# Patient Record
Sex: Female | Born: 1985 | Race: Black or African American | Hispanic: No | Marital: Single | Smoking: Never smoker
Health system: Southern US, Community
[De-identification: ages and names within clinical notes are randomized; demographics above are authoritative.]

## PROBLEM LIST (undated history)

## (undated) DIAGNOSIS — A749 Chlamydial infection, unspecified: Secondary | ICD-10-CM

## (undated) DIAGNOSIS — K469 Unspecified abdominal hernia without obstruction or gangrene: Secondary | ICD-10-CM

---
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
## 2009-03-20 ENCOUNTER — Inpatient Hospital Stay (HOSPITAL_COMMUNITY): Admission: AD | Admit: 2009-03-20 | Discharge: 2009-03-20 | Payer: Self-pay | Admitting: Psychiatry

## 2009-04-09 ENCOUNTER — Inpatient Hospital Stay (HOSPITAL_COMMUNITY): Admission: AD | Admit: 2009-04-09 | Discharge: 2009-04-09 | Payer: Self-pay | Admitting: Obstetrics & Gynecology

## 2009-05-05 ENCOUNTER — Ambulatory Visit (HOSPITAL_COMMUNITY): Admission: RE | Admit: 2009-05-05 | Discharge: 2009-05-05 | Payer: Self-pay | Admitting: Obstetrics & Gynecology

## 2009-05-31 ENCOUNTER — Inpatient Hospital Stay (HOSPITAL_COMMUNITY): Admission: AD | Admit: 2009-05-31 | Discharge: 2009-05-31 | Payer: Self-pay | Admitting: Obstetrics & Gynecology

## 2009-09-01 ENCOUNTER — Inpatient Hospital Stay (HOSPITAL_COMMUNITY): Admission: AD | Admit: 2009-09-01 | Discharge: 2009-09-01 | Payer: Self-pay | Admitting: Obstetrics & Gynecology

## 2009-09-27 ENCOUNTER — Inpatient Hospital Stay (HOSPITAL_COMMUNITY): Admission: AD | Admit: 2009-09-27 | Discharge: 2009-09-27 | Payer: Self-pay | Admitting: Obstetrics

## 2009-10-01 ENCOUNTER — Ambulatory Visit (HOSPITAL_COMMUNITY): Admission: RE | Admit: 2009-10-01 | Discharge: 2009-10-01 | Payer: Self-pay | Admitting: Obstetrics & Gynecology

## 2009-10-04 ENCOUNTER — Inpatient Hospital Stay (HOSPITAL_COMMUNITY): Admission: AD | Admit: 2009-10-04 | Discharge: 2009-10-07 | Payer: Self-pay | Admitting: Obstetrics & Gynecology

## 2009-11-14 ENCOUNTER — Emergency Department (HOSPITAL_COMMUNITY): Admission: EM | Admit: 2009-11-14 | Discharge: 2009-11-14 | Payer: Self-pay | Admitting: Family Medicine

## 2010-05-01 ENCOUNTER — Emergency Department (HOSPITAL_COMMUNITY)
Admission: EM | Admit: 2010-05-01 | Discharge: 2010-05-01 | Payer: Self-pay | Source: Home / Self Care | Admitting: Emergency Medicine

## 2010-07-20 LAB — CBC
HCT: 31.9 % — ABNORMAL LOW (ref 36.0–46.0)
Hemoglobin: 11.1 g/dL — ABNORMAL LOW (ref 12.0–15.0)
Hemoglobin: 10.2 g/dL — ABNORMAL LOW (ref 12.0–15.0)
MCHC: 33.4 g/dL (ref 30.0–36.0)
MCHC: 33.8 g/dL (ref 30.0–36.0)
MCV: 91.6 fL (ref 78.0–100.0)
MCV: 92.1 fL (ref 78.0–100.0)
Platelets: 182 10*3/uL (ref 150–400)
RBC: 3.47 MIL/uL — ABNORMAL LOW (ref 3.87–5.11)
RBC: 3.29 MIL/uL — ABNORMAL LOW (ref 3.87–5.11)
RBC: 3.48 MIL/uL — ABNORMAL LOW (ref 3.87–5.11)
RDW: 12.9 % (ref 11.5–15.5)

## 2010-07-20 LAB — ABO/RH: ABO/RH(D): O POS

## 2010-07-21 LAB — URINALYSIS, ROUTINE W REFLEX MICROSCOPIC
Nitrite: NEGATIVE
Protein, ur: NEGATIVE mg/dL
Specific Gravity, Urine: 1.01 (ref 1.005–1.030)
Urobilinogen, UA: 0.2 mg/dL (ref 0.0–1.0)

## 2011-04-13 ENCOUNTER — Emergency Department (HOSPITAL_COMMUNITY)
Admission: EM | Admit: 2011-04-13 | Discharge: 2011-04-13 | Discharge status: Against Medical Advice | Payer: Self-pay | Attending: Emergency Medicine | Admitting: Emergency Medicine

## 2011-04-13 ENCOUNTER — Encounter: Payer: Self-pay | Admitting: Emergency Medicine

## 2011-04-13 DIAGNOSIS — H109 Unspecified conjunctivitis: Secondary | ICD-10-CM

## 2011-04-13 DIAGNOSIS — H5789 Other specified disorders of eye and adnexa: Secondary | ICD-10-CM | POA: Insufficient documentation

## 2011-04-13 DIAGNOSIS — H538 Other visual disturbances: Secondary | ICD-10-CM | POA: Insufficient documentation

## 2011-04-13 DIAGNOSIS — H5710 Ocular pain, unspecified eye: Secondary | ICD-10-CM | POA: Insufficient documentation

## 2011-04-13 MED ORDER — POLYMYXIN B-TRIMETHOPRIM 10000-0.1 UNIT/ML-% OP SOLN: 1.0000 [drp] | OPHTHALMIC | Status: AC

## 2011-04-13 NOTE — ED Provider Notes (Signed)
History     CSN: [id number] Arrival date & time: 04/13/2011  8:13 PM   None     Chief Complaint  Patient presents with  . Eye Pain    possible pink eye    (Consider location/radiation/quality/duration/timing/severity/associated sxs/prior treatment) HPI Comments: Patient reports, right by discharge, redness, slight blurring of the vision for the past 5 days wakes in the morning with eyes matted shut.  Has not tried any therapies other than washing with soap and water  Patient is a 25 y.o. female presenting with eye pain. The history is provided by the patient.  Eye Pain This is a new problem. The current episode started in the past 7 days. The problem occurs constantly. The problem has been unchanged. Pertinent negatives include no nausea, neck pain or visual change.    History reviewed. No pertinent past medical history.  History reviewed. No pertinent past surgical history.  No family history on file.  History  Substance Use Topics  . Smoking status: Never Smoker   . Smokeless tobacco: Not on file  . Alcohol Use: Not on file    OB History    Grav Para Term Preterm Abortions TAB SAB Ect Mult Living                  Review of Systems  Constitutional: Negative.   HENT: Negative for facial swelling and neck pain.   Eyes: Positive for pain and discharge. Negative for photophobia and visual disturbance.  Respiratory: Negative.   Cardiovascular: Negative.   Gastrointestinal: Negative.  Negative for nausea.  Genitourinary: Negative.   Musculoskeletal: Negative.   Neurological: Negative.   Hematological: Negative.   Psychiatric/Behavioral: Negative.     Allergies  Review of patient's allergies indicates no known allergies.  Home Medications   Current Outpatient Rx  Name Route Sig Dispense Refill  . POLYMYXIN B-TRIMETHOPRIM 10000-0.1 UNIT/ML-% OP SOLN Right Eye Place 1 drop into the right eye every 4 (four) hours. 10 mL 0    BP 124/78  Pulse 70  Temp(Src)  98.4 F (36.9 C) (Oral)  Resp 19  SpO2 98%  Physical Exam  Constitutional: She is oriented to person, place, and time. She appears well-developed.  HENT:  Head: Normocephalic.  Eyes: Pupils are equal, round, and reactive to light. Right conjunctiva is injected. Right conjunctiva has no hemorrhage. Right eye exhibits normal extraocular motion and no nystagmus.  Neck: Normal range of motion.  Cardiovascular: Normal rate.   Pulmonary/Chest: Effort normal.  Musculoskeletal: Normal range of motion.  Neurological: She is oriented to person, place, and time.  Skin: Skin is warm.  Psychiatric: She has a normal mood and affect.    ED Course  Procedures (including critical care time)  Labs Reviewed - No data to display No results found.   1. Conjunctivitis       MDM  After exam and careful taking of the history this is most likely, conjunctivitis.  We'll treat with antibiotic ointment and refer patient to ophthalmology for followup        Arman Filter, NP 04/13/11 2126  Arman Filter, NP 04/13/11 2129

## 2011-04-14 NOTE — ED Provider Notes (Signed)
Evaluation and management procedures were performed by the PA/NP under my supervision/collaboration.   Kenneshia Rehm, MD 04/14/11 0035 

## 2011-05-04 NOTE — L&D Delivery Note (Signed)
Delivery Note At 11:02 AM a viable female was delivered via  (Presentation: LOP ).     Placenta status: delivered w/cord traction; intact .  Cord: 3 vessels.    Anesthesia: Epidural, local   Episiotomy: none Lacerations: labial Suture Repair: 3.0 vicryl rapide Est. Blood Loss (mL): 200 ml  Mom to postpartum.  Baby to nursery-stable.  [name],Dyan Creelman A 01/31/2012, 11:16 AM

## 2011-07-15 ENCOUNTER — Encounter (HOSPITAL_COMMUNITY): Payer: Self-pay | Admitting: *Deleted

## 2011-07-15 ENCOUNTER — Emergency Department (HOSPITAL_COMMUNITY)
Admission: EM | Admit: 2011-07-15 | Discharge: 2011-07-15 | Disposition: A | Discharge status: Home / Self Care | Payer: Self-pay | Attending: Emergency Medicine | Admitting: Emergency Medicine

## 2011-07-15 DIAGNOSIS — O9989 Other specified diseases and conditions complicating pregnancy, childbirth and the puerperium: Secondary | ICD-10-CM | POA: Insufficient documentation

## 2011-07-15 DIAGNOSIS — K439 Ventral hernia without obstruction or gangrene: Secondary | ICD-10-CM | POA: Insufficient documentation

## 2011-07-15 HISTORY — DX: Unspecified abdominal hernia without obstruction or gangrene: K46.9

## 2011-07-15 LAB — URINALYSIS, ROUTINE W REFLEX MICROSCOPIC: Glucose, UA: NEGATIVE mg/dL

## 2011-07-15 LAB — URINE MICROSCOPIC-ADD ON

## 2011-07-15 LAB — POCT PREGNANCY, URINE: Preg Test, Ur: POSITIVE — AB

## 2011-07-15 NOTE — Discharge Instructions (Signed)
Hernia A hernia occurs when an internal organ pushes out through a weak spot in the abdominal wall. Hernias most commonly occur in the groin and around the navel. Hernias often can be pushed back into place (reduced). Most hernias tend to get worse over time. Some abdominal hernias can get stuck in the opening (irreducible or incarcerated hernia) and cannot be reduced. An irreducible abdominal hernia which is tightly squeezed into the opening is at risk for impaired blood supply (strangulated hernia). A strangulated hernia is a medical emergency. Because of the risk for an irreducible or strangulated hernia, surgery may be recommended to repair a hernia. CAUSES   Heavy lifting.   Prolonged coughing.   Straining to have a bowel movement.   A cut (incision) made during an abdominal surgery.  HOME CARE INSTRUCTIONS   Bed rest is not required. You may continue your normal activities.   Avoid lifting more than 10 pounds (4.5 kg) or straining.   Cough gently. If you are a smoker it is best to stop. Even the best hernia repair can break down with the continual strain of coughing. Even if you do not have your hernia repaired, a cough will continue to aggravate the problem.   Do not wear anything tight over your hernia. Do not try to keep it in with an outside bandage or truss. These can damage abdominal contents if they are trapped within the hernia sac.   Eat a normal diet.   Avoid constipation. Straining over long periods of time will increase hernia size and encourage breakdown of repairs. If you cannot do this with diet alone, stool softeners may be used.  SEEK IMMEDIATE MEDICAL CARE IF:   You have a fever.   You develop increasing abdominal pain.   You feel nauseous or vomit.   Your hernia is stuck outside the abdomen, looks discolored, feels hard, or is tender.   You have any changes in your bowel habits or in the hernia that are unusual for you.   You have increased pain or  swelling around the hernia.   You cannot push the hernia back in place by applying gentle pressure while lying down.  MAKE SURE YOU:   Understand these instructions.   Will watch your condition.   Will get help right away if you are not doing well or get worse.  Document Released: 04/19/2005 Document Revised: 04/08/2011 Document Reviewed: 12/07/2007 [facility] Patient Information 2012 [place], [place]. You have a ventral hernia as discussed emergency surgery would be performed only if it became incarcerated or stalk.  Please discuss this with OB when you have your appointment in a couple days.  Can take Tylenol for any discomfort that you having at this time.  He also can wear a girdle or abdominal binder for support

## 2011-07-15 NOTE — ED Provider Notes (Signed)
History     CSN: [id number]  Arrival date & time 07/15/11  1621   First MD Initiated Contact with Patient 07/15/11 2003      Chief Complaint  Patient presents with  . Abdominal Pain    (Consider location/radiation/quality/duration/timing/severity/associated sxs/prior treatment) HPI Comments: Patient has a known ventral, hernia.  That's been there since the birth of her 72-1/24-year-old son.  She is now 3 months pregnant and finds that it is coming out more often and uncomfortable it is soft and easily reducible.  She has an appointment with her OB/GYN in 2 days  Patient is a 26 y.o. female presenting with abdominal pain. The history is provided by the patient.  Abdominal Pain The primary symptoms of the illness include abdominal pain. The primary symptoms of the illness do not include fever, vomiting, diarrhea or dysuria. The onset of the illness was gradual. The problem has not changed since onset. Symptoms associated with the illness do not include constipation.    Past Medical History  Diagnosis Date  . Hernia     History reviewed. No pertinent past surgical history.  History reviewed. No pertinent family history.  History  Substance Use Topics  . Smoking status: Never Smoker   . Smokeless tobacco: Not on file  . Alcohol Use: No    OB History    Grav Para Term Preterm Abortions TAB SAB Ect Mult Living                  Review of Systems  Constitutional: Negative for fever.  Gastrointestinal: Positive for abdominal pain. Negative for vomiting, diarrhea, constipation and abdominal distention.  Genitourinary: Negative for dysuria.  Neurological: Negative for dizziness and weakness.    Allergies  Review of patient's allergies indicates no known allergies.  Home Medications  No current outpatient prescriptions on file.  BP 119/72  Pulse 99  Temp(Src) 98.5 F (36.9 C) (Oral)  Resp 18  SpO2 100%  LMP 04/25/2011  Physical Exam  Constitutional: She appears  well-developed.  HENT:  Head: Normocephalic.  Eyes: Pupils are equal, round, and reactive to light.  Neck: Normal range of motion.  Cardiovascular: Normal rate and regular rhythm.   Pulmonary/Chest: Effort normal.  Abdominal: Soft. Bowel sounds are normal. She exhibits no distension.       A 4 cm ventral hernia, easily reduced  Skin: Skin is warm.    ED Course  Procedures (including critical care time)  Labs Reviewed  POCT PREGNANCY, URINE - Abnormal; Notable for the following:    Preg Test, Ur POSITIVE (*)    All other components within normal limits  URINALYSIS, ROUTINE W REFLEX MICROSCOPIC   No results found.   1. Ventral hernia       MDM  Small, easily reduced ventral hernia, discussed signs and symptoms that she should be aware for concern and immediate return        Arman Filter, NP 07/15/11 2024  Arman Filter, NP 07/15/11 2025

## 2011-07-15 NOTE — ED Notes (Signed)
Pt reports pain to upper abdomen, pt with hx of hernia. Pt reports pain increases with certain movements and stressors. No associated symptoms.

## 2011-07-15 NOTE — ED Provider Notes (Signed)
Medical screening examination/treatment/procedure(s) were performed by non-physician practitioner and as supervising physician I was immediately available for consultation/collaboration.   Dayton Bailiff, MD 07/15/11 [phone]

## 2011-09-07 ENCOUNTER — Other Ambulatory Visit: Payer: Self-pay | Admitting: Obstetrics

## 2011-09-07 DIAGNOSIS — Z3689 Encounter for other specified antenatal screening: Secondary | ICD-10-CM

## 2011-09-08 ENCOUNTER — Ambulatory Visit (HOSPITAL_COMMUNITY)
Admission: RE | Admit: 2011-09-08 | Discharge: 2011-09-08 | Disposition: A | Discharge status: Home / Self Care | Payer: Medicaid Other | Source: Ambulatory Visit | Attending: Obstetrics | Admitting: Obstetrics

## 2011-09-08 DIAGNOSIS — Z1389 Encounter for screening for other disorder: Secondary | ICD-10-CM | POA: Insufficient documentation

## 2011-09-08 DIAGNOSIS — Z3689 Encounter for other specified antenatal screening: Secondary | ICD-10-CM

## 2011-09-08 DIAGNOSIS — Z363 Encounter for antenatal screening for malformations: Secondary | ICD-10-CM | POA: Insufficient documentation

## 2011-09-08 DIAGNOSIS — O358XX0 Maternal care for other (suspected) fetal abnormality and damage, not applicable or unspecified: Secondary | ICD-10-CM | POA: Insufficient documentation

## 2012-01-24 ENCOUNTER — Inpatient Hospital Stay (HOSPITAL_COMMUNITY): Payer: Medicaid Other

## 2012-01-24 ENCOUNTER — Encounter (HOSPITAL_COMMUNITY): Payer: Self-pay | Admitting: *Deleted

## 2012-01-24 ENCOUNTER — Inpatient Hospital Stay (HOSPITAL_COMMUNITY)
Admission: AD | Admit: 2012-01-24 | Discharge: 2012-01-24 | Disposition: A | Discharge status: Home / Self Care | Payer: Medicaid Other | Source: Ambulatory Visit | Attending: Obstetrics & Gynecology | Admitting: Obstetrics & Gynecology

## 2012-01-24 DIAGNOSIS — R109 Unspecified abdominal pain: Secondary | ICD-10-CM | POA: Insufficient documentation

## 2012-01-24 DIAGNOSIS — B9689 Other specified bacterial agents as the cause of diseases classified elsewhere: Secondary | ICD-10-CM

## 2012-01-24 DIAGNOSIS — B373 Candidiasis of vulva and vagina: Secondary | ICD-10-CM

## 2012-01-24 DIAGNOSIS — N898 Other specified noninflammatory disorders of vagina: Secondary | ICD-10-CM

## 2012-01-24 DIAGNOSIS — O368190 Decreased fetal movements, unspecified trimester, not applicable or unspecified: Secondary | ICD-10-CM | POA: Insufficient documentation

## 2012-01-24 DIAGNOSIS — O99891 Other specified diseases and conditions complicating pregnancy: Secondary | ICD-10-CM | POA: Insufficient documentation

## 2012-01-24 LAB — AMNISURE RUPTURE OF MEMBRANE (ROM) NOT AT ARMC: Amnisure ROM: NEGATIVE

## 2012-01-24 LAB — WET PREP, GENITAL: Yeast Wet Prep HPF POC: NONE SEEN

## 2012-01-24 MED ORDER — METRONIDAZOLE 500 MG PO TABS: 500.0000 mg | ORAL_TABLET | Freq: Two times a day (BID) | ORAL | Status: DC

## 2012-01-24 MED ORDER — TERCONAZOLE 0.4 % VA CREA: 1.0000 | TOPICAL_CREAM | Freq: Every day | VAGINAL | Status: DC

## 2012-01-24 NOTE — MAU Note (Signed)
Amnisure collected per standard protocol

## 2012-01-24 NOTE — MAU Note (Signed)
Pt reports leaking fluid since 1900, having pain and pressure. Decreased fetal movement today.

## 2012-01-24 NOTE — MAU Provider Note (Signed)
Chief Complaint:  Rupture of Membranes   First Provider Initiated Contact with Patient 01/24/12 2237      HPI: Darlene [name] is a 26 y.o. G2P1001 at 25w3dwho presents to maternity admissions reporting LOF since 1900. Small amountf clear fluid running down her leg. Mild-mod UC's.  Denies vaginal bleeding. Decreased Fm earlier this evening, but good fetal movement in MAU.   Past Medical History: Past Medical History  Diagnosis Date  . Hernia     Past obstetric history: OB History    Grav Para Term Preterm Abortions TAB SAB Ect Mult Living   2 1 1       1      # Outc Date GA Lbr Len/2nd Wgt Sex Del Anes PTL Lv   1 TRM            2 CUR               Past Surgical History: History reviewed. No pertinent past surgical history.  Family History: Family History  Problem Relation Age of Onset  . Cancer Mother     Social History: History  Substance Use Topics  . Smoking status: Never Smoker   . Smokeless tobacco: Not on file  . Alcohol Use: No    Allergies: No Known Allergies  Meds:  Prescriptions prior to admission  Medication Sig Dispense Refill  . acetaminophen (TYLENOL) 500 MG tablet Take 500 mg by mouth every 6 (six) hours as needed. For pain or headache      . Prenatal Vit-Fe Fumarate-FA (PRENATAL MULTIVITAMIN) TABS Take 1 tablet by mouth daily.        ROS: Pertinent findings in history of present illness.  Physical Exam  Blood pressure 142/80, pulse 78, temperature 98 F (36.7 C), temperature source Oral, resp. rate 18, height 5\' 4"  (1.626 m), weight 82.101 kg (181 lb), last menstrual period 04/25/2011, SpO2 100.00%. GENERAL: Well-developed, well-nourished female in no acute distress.  HEENT: normocephalic HEART: normal rate RESP: normal effort ABDOMEN: Soft, non-tender, gravid appropriate for gestational age EXTREMITIES: Nontender, no edema NEURO: alert and oriented SPECULUM EXAM: NEFG,  Large amount amount of curd-like discharge and small amount of clear  fluid, no blood, cervix clean Fern neg  Dilation: 1 Effacement (%): Thick Cervical Position: Posterior Station: Ballotable Presentation: Vertex Exam by:: V.Albena Comes,CNM  FHT:  Baseline 130, Intermittent tracing moderate variability, accelerations present, possible mild variable decelerations Contractions: Irreg, mild  Labs: Results for orders placed during the hospital encounter of 01/24/12 (from the past 24 hour(s))  WET PREP, GENITAL     Status: Abnormal   Collection Time   01/24/12  9:45 PM      Component Value Range   Yeast Wet Prep HPF POC NONE SEEN  NONE SEEN   Trich, Wet Prep NONE SEEN  NONE SEEN   Clue Cells Wet Prep HPF POC MANY (*) NONE SEEN   WBC, Wet Prep HPF POC MODERATE (*) NONE SEEN  AMNISURE RUPTURE OF MEMBRANE (ROM)     Status: Normal   Collection Time   01/24/12 10:25 PM      Component Value Range   Amnisure ROM NEGATIVE      Imaging:  No results found. ED Course 2243: Amnisure neg. FHR 150's , moderate variability, no acels or decels. Will continue fetal monitoring until category I. 2305: BPP ordered per consult w/ Dr. Tamela Oddi. BPP 8/8  Assessment: 1. Decreased fetal movement in pregnancy, antepartum   2. Vaginal discharge in pregnancy   3. Vulvovaginal  candidiasis   4. BV (bacterial vaginosis)    Plan: Discharge home Labor precautions and fetal kick counts Follow-up Information    Follow up with Roseanna Rainbow, MD. On 01/27/2012.   Contact information:   [address] [place] [address] [phone]           Medication List     As of 02/01/2012  7:22 AM    TAKE these medications         acetaminophen 500 MG tablet   Commonly known as: TYLENOL   Take 500 mg by mouth every 6 (six) hours as needed. For pain or headache       Flagyl BID x 7 days Terazol 7  [place], [place] 01/24/2012 10:41 PM

## 2012-01-31 ENCOUNTER — Inpatient Hospital Stay (HOSPITAL_COMMUNITY): Payer: Medicaid Other | Admitting: Anesthesiology

## 2012-01-31 ENCOUNTER — Encounter (HOSPITAL_COMMUNITY): Payer: Self-pay | Admitting: Anesthesiology

## 2012-01-31 ENCOUNTER — Encounter (HOSPITAL_COMMUNITY): Payer: Self-pay | Admitting: *Deleted

## 2012-01-31 ENCOUNTER — Inpatient Hospital Stay (HOSPITAL_COMMUNITY)
Admission: AD | Admit: 2012-01-31 | Discharge: 2012-02-02 | DRG: 775 | Disposition: A | Discharge status: Home / Self Care | Payer: Medicaid Other | Source: Ambulatory Visit | Attending: Obstetrics & Gynecology | Admitting: Obstetrics & Gynecology

## 2012-01-31 DIAGNOSIS — O99892 Other specified diseases and conditions complicating childbirth: Secondary | ICD-10-CM | POA: Diagnosis present

## 2012-01-31 DIAGNOSIS — IMO0001 Reserved for inherently not codable concepts without codable children: Secondary | ICD-10-CM

## 2012-01-31 DIAGNOSIS — Z22330 Carrier of Group B streptococcus: Secondary | ICD-10-CM

## 2012-01-31 HISTORY — DX: Chlamydial infection, unspecified: A74.9

## 2012-01-31 LAB — CBC
HCT: 34.4 % — ABNORMAL LOW (ref 36.0–46.0)
Hemoglobin: 11.1 g/dL — ABNORMAL LOW (ref 12.0–15.0)
MCH: 29.2 pg (ref 26.0–34.0)
MCV: 90.5 fL (ref 78.0–100.0)
Platelets: 214 10*3/uL (ref 150–400)
RBC: 3.8 MIL/uL — ABNORMAL LOW (ref 3.87–5.11)
RDW: 14.1 % (ref 11.5–15.5)
WBC: 10 10*3/uL (ref 4.0–10.5)

## 2012-01-31 LAB — RPR: RPR Ser Ql: NONREACTIVE

## 2012-01-31 MED ORDER — EPHEDRINE 5 MG/ML INJ
10.0000 mg | INTRAVENOUS | Status: DC | PRN
  Filled 2012-01-31: qty 2

## 2012-01-31 MED ORDER — OXYTOCIN 40 UNITS IN LACTATED RINGERS INFUSION - SIMPLE MED: 62.5000 mL/h | Freq: Once | INTRAVENOUS | Status: DC

## 2012-01-31 MED ORDER — ZOLPIDEM TARTRATE 5 MG PO TABS: 5.0000 mg | ORAL_TABLET | Freq: Every evening | ORAL | Status: DC | PRN

## 2012-01-31 MED ORDER — FERROUS SULFATE 325 (65 FE) MG PO TABS
325.0000 mg | ORAL_TABLET | Freq: Two times a day (BID) | ORAL | Status: DC
  Administered 2012-01-31 – 2012-02-02 (×4): 325 mg via ORAL
  Filled 2012-01-31 (×4): qty 1

## 2012-01-31 MED ORDER — ACETAMINOPHEN 325 MG PO TABS: 650.0000 mg | ORAL_TABLET | ORAL | Status: DC | PRN

## 2012-01-31 MED ORDER — LACTATED RINGERS IV SOLN: 500.0000 mL | Freq: Once | INTRAVENOUS | Status: DC

## 2012-01-31 MED ORDER — OXYCODONE-ACETAMINOPHEN 5-325 MG PO TABS: 1.0000 | ORAL_TABLET | ORAL | Status: DC | PRN

## 2012-01-31 MED ORDER — EPHEDRINE 5 MG/ML INJ
10.0000 mg | INTRAVENOUS | Status: DC | PRN
  Filled 2012-01-31: qty 2
  Filled 2012-01-31: qty 4

## 2012-01-31 MED ORDER — LANOLIN HYDROUS EX OINT: TOPICAL_OINTMENT | CUTANEOUS | Status: DC | PRN

## 2012-01-31 MED ORDER — MAGNESIUM HYDROXIDE 400 MG/5ML PO SUSP: 30.0000 mL | ORAL | Status: DC | PRN

## 2012-01-31 MED ORDER — FLEET ENEMA 7-19 GM/118ML RE ENEM: 1.0000 | ENEMA | Freq: Once | RECTAL | Status: DC

## 2012-01-31 MED ORDER — LACTATED RINGERS IV SOLN
INTRAVENOUS | Status: DC
  Administered 2012-01-31: 07:00:00 via INTRAVENOUS

## 2012-01-31 MED ORDER — OXYCODONE-ACETAMINOPHEN 5-325 MG PO TABS
1.0000 | ORAL_TABLET | ORAL | Status: DC | PRN
  Administered 2012-02-01 (×3): 1 via ORAL
  Filled 2012-01-31 (×3): qty 1

## 2012-01-31 MED ORDER — PHENYLEPHRINE 40 MCG/ML (10ML) SYRINGE FOR IV PUSH (FOR BLOOD PRESSURE SUPPORT)
80.0000 ug | PREFILLED_SYRINGE | INTRAVENOUS | Status: DC | PRN
  Filled 2012-01-31: qty 5
  Filled 2012-01-31: qty 2

## 2012-01-31 MED ORDER — SODIUM BICARBONATE 8.4 % IV SOLN
INTRAVENOUS | Status: DC | PRN
  Administered 2012-01-31: 4 mL via EPIDURAL

## 2012-01-31 MED ORDER — DIPHENHYDRAMINE HCL 25 MG PO CAPS: 25.0000 mg | ORAL_CAPSULE | Freq: Four times a day (QID) | ORAL | Status: DC | PRN

## 2012-01-31 MED ORDER — PENICILLIN G POTASSIUM 5000000 UNITS IJ SOLR
5.0000 10*6.[IU] | Freq: Once | INTRAVENOUS | Status: AC
  Administered 2012-01-31: 5 10*6.[IU] via INTRAVENOUS
  Filled 2012-01-31: qty 5

## 2012-01-31 MED ORDER — IBUPROFEN 600 MG PO TABS: 600.0000 mg | ORAL_TABLET | Freq: Four times a day (QID) | ORAL | Status: DC | PRN

## 2012-01-31 MED ORDER — OXYTOCIN 40 UNITS IN LACTATED RINGERS INFUSION - SIMPLE MED
INTRAVENOUS | Status: AC
  Administered 2012-01-31: 40 [IU]
  Filled 2012-01-31: qty 1000

## 2012-01-31 MED ORDER — PHENYLEPHRINE 40 MCG/ML (10ML) SYRINGE FOR IV PUSH (FOR BLOOD PRESSURE SUPPORT)
80.0000 ug | PREFILLED_SYRINGE | INTRAVENOUS | Status: DC | PRN
  Filled 2012-01-31: qty 2

## 2012-01-31 MED ORDER — OXYTOCIN BOLUS FROM INFUSION
500.0000 mL | Freq: Once | INTRAVENOUS | Status: DC
  Filled 2012-01-31: qty 500

## 2012-01-31 MED ORDER — FENTANYL 2.5 MCG/ML BUPIVACAINE 1/10 % EPIDURAL INFUSION (WH - ANES)
14.0000 mL/h | INTRAMUSCULAR | Status: DC
  Filled 2012-01-31: qty 60

## 2012-01-31 MED ORDER — LIDOCAINE HCL (PF) 1 % IJ SOLN: 30.0000 mL | INTRAMUSCULAR | Status: DC | PRN

## 2012-01-31 MED ORDER — CITRIC ACID-SODIUM CITRATE 334-500 MG/5ML PO SOLN: 30.0000 mL | ORAL | Status: DC | PRN

## 2012-01-31 MED ORDER — SENNOSIDES-DOCUSATE SODIUM 8.6-50 MG PO TABS
2.0000 | ORAL_TABLET | Freq: Every day | ORAL | Status: DC
  Administered 2012-01-31 – 2012-02-01 (×2): 2 via ORAL

## 2012-01-31 MED ORDER — ONDANSETRON HCL 4 MG PO TABS: 4.0000 mg | ORAL_TABLET | ORAL | Status: DC | PRN

## 2012-01-31 MED ORDER — PENICILLIN G POTASSIUM 5000000 UNITS IJ SOLR
2.5000 10*6.[IU] | INTRAVENOUS | Status: DC
  Filled 2012-01-31 (×2): qty 2.5

## 2012-01-31 MED ORDER — LIDOCAINE HCL (PF) 1 % IJ SOLN
INTRAMUSCULAR | Status: AC
  Administered 2012-01-31: 30 mL
  Filled 2012-01-31: qty 30

## 2012-01-31 MED ORDER — DIBUCAINE 1 % RE OINT: 1.0000 "application " | TOPICAL_OINTMENT | RECTAL | Status: DC | PRN

## 2012-01-31 MED ORDER — TETANUS-DIPHTH-ACELL PERTUSSIS 5-2.5-18.5 LF-MCG/0.5 IM SUSP
0.5000 mL | Freq: Once | INTRAMUSCULAR | Status: DC
  Filled 2012-01-31 (×2): qty 0.5

## 2012-01-31 MED ORDER — DIPHENHYDRAMINE HCL 50 MG/ML IJ SOLN: 12.5000 mg | INTRAMUSCULAR | Status: DC | PRN

## 2012-01-31 MED ORDER — BENZOCAINE-MENTHOL 20-0.5 % EX AERO
1.0000 "application " | INHALATION_SPRAY | CUTANEOUS | Status: DC | PRN
  Administered 2012-01-31: 1 via TOPICAL
  Filled 2012-01-31: qty 56

## 2012-01-31 MED ORDER — LACTATED RINGERS IV SOLN: 500.0000 mL | INTRAVENOUS | Status: DC | PRN

## 2012-01-31 MED ORDER — ONDANSETRON HCL 4 MG/2ML IJ SOLN: 4.0000 mg | Freq: Four times a day (QID) | INTRAMUSCULAR | Status: DC | PRN

## 2012-01-31 MED ORDER — IBUPROFEN 600 MG PO TABS
600.0000 mg | ORAL_TABLET | Freq: Four times a day (QID) | ORAL | Status: DC
  Administered 2012-01-31 – 2012-02-02 (×6): 600 mg via ORAL
  Filled 2012-01-31 (×7): qty 1

## 2012-01-31 MED ORDER — PRENATAL MULTIVITAMIN CH
1.0000 | ORAL_TABLET | Freq: Every day | ORAL | Status: DC
  Administered 2012-01-31 – 2012-02-02 (×3): 1 via ORAL
  Filled 2012-01-31 (×3): qty 1

## 2012-01-31 MED ORDER — MEASLES, MUMPS & RUBELLA VAC ~~LOC~~ INJ: 0.5000 mL | INJECTION | Freq: Once | SUBCUTANEOUS | Status: DC

## 2012-01-31 MED ORDER — FENTANYL 2.5 MCG/ML BUPIVACAINE 1/10 % EPIDURAL INFUSION (WH - ANES)
INTRAMUSCULAR | Status: DC | PRN
  Administered 2012-01-31: 14 mL/h via EPIDURAL

## 2012-01-31 MED ORDER — WITCH HAZEL-GLYCERIN EX PADS: 1.0000 "application " | MEDICATED_PAD | CUTANEOUS | Status: DC | PRN

## 2012-01-31 MED ORDER — ONDANSETRON HCL 4 MG/2ML IJ SOLN: 4.0000 mg | INTRAMUSCULAR | Status: DC | PRN

## 2012-01-31 NOTE — H&P (Signed)
Darlene [name] is a 26 y.o. female presenting for contractions. Maternal Medical History:  Reason for admission: Reason for admission: contractions.  Contractions: Frequency: regular.   Perceived severity is strong.    Fetal activity: Perceived fetal activity is normal.    Prenatal complications: no prenatal complications   OB History    Grav Para Term Preterm Abortions TAB SAB Ect Mult Living   2 1 1       1      Past Medical History  Diagnosis Date  . Hernia   . Chlamydia    History reviewed. No pertinent past surgical history. Family History: family history includes Cancer in her mother. Social History:  reports that she has never smoked. She has never used smokeless tobacco. She reports that she does not drink alcohol or use illicit drugs.     Review of Systems  Constitutional: Negative for fever.  Eyes: Negative for blurred vision.  Respiratory: Negative for shortness of breath.   Gastrointestinal: Negative for vomiting.  Skin: Negative for rash.  Neurological: Negative for headaches.    Dilation: 6 Effacement (%): 100 Station: -1 Exam by:: S. Carrera, RNC Blood pressure 123/78, pulse 72, temperature 98 F (36.7 C), temperature source Oral, resp. rate 20, height 5\' 4"  (1.626 m), weight 81.647 kg (180 lb), last menstrual period 04/25/2011, SpO2 99.00%. Maternal Exam:  Uterine Assessment: Contraction frequency is regular.   Abdomen: not evaluated.  Introitus: not evaluated.   Cervix: not evaluated.   Fetal Exam Fetal Monitor Review: Variability: moderate (6-25 bpm).   Pattern: accelerations present and no decelerations.    Fetal State Assessment: Category I - tracings are normal.     Physical Exam  Constitutional: She appears well-developed.  HENT:  Head: Normocephalic.  Neck: Neck supple. No thyromegaly present.  Cardiovascular: Normal rate and regular rhythm.   Respiratory: Breath sounds normal.  GI: Soft. Bowel sounds are normal.  Skin: No  rash noted.    Prenatal labs: ABO, Rh:   Antibody: Negative (05/07 0000) Rubella: Immune (05/07 0000) RPR: Nonreactive (05/07 0000)  HBsAg: Negative (05/07 0000)  HIV:    GBS: Positive (09/03 0000)   Assessment/Plan: Primipara at term, active labor, Category 1 FHT Admit, anticipate an NSVD   [name],[name] A 01/31/2012, 9:28 AM

## 2012-01-31 NOTE — Anesthesia Postprocedure Evaluation (Signed)
  Anesthesia Post-op Note  Patient: Darlene [name]  Procedure(s) Performed: * No procedures listed *  Patient Location: Mother/Baby  Anesthesia Type: Epidural  Level of Consciousness: awake, alert  and oriented  Airway and Oxygen Therapy: Patient Spontanous Breathing  Post-op Pain: none  Post-op Assessment: Post-op Vital signs reviewed and Patient's Cardiovascular Status Stable  Post-op Vital Signs: Reviewed and stable  Complications: No apparent anesthesia complications

## 2012-01-31 NOTE — Anesthesia Procedure Notes (Signed)
Epidural Patient location during procedure: OB  Preanesthetic Checklist Completed: patient identified, site marked, surgical consent, pre-op evaluation, timeout performed, IV checked, risks and benefits discussed and monitors and equipment checked  Epidural Patient position: sitting Prep: site prepped and draped and DuraPrep Patient monitoring: continuous pulse ox and blood pressure Approach: midline Injection technique: LOR air  Needle:  Needle type: Tuohy  Needle gauge: 17 G Needle length: 9 cm and 9 Needle insertion depth: 6 cm Catheter type: closed end flexible Catheter size: 19 Gauge Catheter at skin depth: 12 cm Test dose: negative  Assessment Events: blood not aspirated, injection not painful, no injection resistance, negative IV test and no paresthesia  Additional Notes Dosing of Epidural:  1st dose, through needle ............................................. epi 1:200K + Xylocaine 40 mg  2nd dose, through catheter, after waiting 3 minutes.....epi 1:200K + Xylocaine 40 mg  3rd dose, through catheter after waiting 3 minutes .............................Marcaine   4mg   ( mg Marcaine are expressed as equivilent  cc's medication removed from the 0.1%Bupiv / fentanyl syringe from L&D pump)  ( 2% Xylo charted as a single dose in Epic Meds for ease of charting; actual dosing was fractionated as above, for saftey's sake)  As each dose occurred, patient was free of IV sx; and patient exhibited no evidence of SA injection.  Patient is more comfortable after epidural dosed. Please see RN's note for documentation of vital signs,and FHR which are stable.  Patient reminded not to try to ambulate with numb legs, and that an RN must be present the 1st time she attempts to get up.    

## 2012-01-31 NOTE — MAU Note (Signed)
UC's since 0100.

## 2012-01-31 NOTE — Anesthesia Preprocedure Evaluation (Signed)

## 2012-02-01 NOTE — Progress Notes (Signed)
UR Chart review completed.  

## 2012-02-01 NOTE — Progress Notes (Signed)
Post Partum Day 1 Subjective: no complaints  Objective: Blood pressure 115/67, pulse 86, temperature 97.9 F (36.6 C), temperature source Oral, resp. rate 18, height 5\' 4"  (1.626 m), weight 81.647 kg (180 lb), last menstrual period 04/25/2011, SpO2 100.00%, unknown if currently breastfeeding.  Physical Exam:  General: alert and no distress Lochia: appropriate Uterine Fundus: firm Incision: none DVT Evaluation: No evidence of DVT seen on physical exam.   Basename 01/31/12 0722  HGB 11.1*  HCT 34.4*    Assessment/Plan: Plan for discharge tomorrow   LOS: 1 day   Emiel Kielty A 02/01/2012, 8:35 AM

## 2012-02-02 MED ORDER — TETANUS-DIPHTH-ACELL PERTUSSIS 5-2.5-18.5 LF-MCG/0.5 IM SUSP
0.5000 mL | Freq: Once | INTRAMUSCULAR | Status: AC
  Administered 2012-02-02: 0.5 mL via INTRAMUSCULAR

## 2012-02-02 MED ORDER — INFLUENZA VIRUS VACC SPLIT PF IM SUSP
0.5000 mL | Freq: Once | INTRAMUSCULAR | Status: AC
  Administered 2012-02-02: 0.5 mL via INTRAMUSCULAR
  Filled 2012-02-02: qty 0.5

## 2012-02-02 MED ORDER — OXYCODONE-ACETAMINOPHEN 5-325 MG PO TABS: 1.0000 | ORAL_TABLET | ORAL | Status: DC | PRN

## 2012-02-02 MED ORDER — IBUPROFEN 600 MG PO TABS: 600.0000 mg | ORAL_TABLET | Freq: Four times a day (QID) | ORAL | Status: DC

## 2012-02-02 NOTE — Discharge Summary (Signed)
Obstetric Discharge Summary Reason for Admission: onset of labor Prenatal Procedures: ultrasound Intrapartum Procedures: spontaneous vaginal delivery Postpartum Procedures: none Complications-Operative and Postpartum: none Hemoglobin  Date Value Range Status  01/31/2012 11.1* 12.0 - 15.0 g/dL Final     HCT  Date Value Range Status  01/31/2012 34.4* 36.0 - 46.0 % Final    Physical Exam:  General: alert and no distress Lochia: appropriate Uterine Fundus: firm Incision: none DVT Evaluation: No evidence of DVT seen on physical exam.  Discharge Diagnoses: Term Pregnancy-delivered  Discharge Information: Date: 02/02/2012 Activity: pelvic rest Diet: routine Medications: PNV, Ibuprofen, Colace and Percocet Condition: stable Instructions: refer to practice specific booklet Discharge to: home Follow-up Information    Follow up with [name], MD. Schedule an appointment as soon as possible for [name] visit in 6 weeks.   Contact information:   [address] [place] [address] [phone]          Newborn Data: Live born female  Birth Weight: 6 lb 11.1 oz (3036 g) APGAR: 9, 9  Home with mother.  Darlene [name],Darlene [name] 02/02/2012, 5:02 PM

## 2012-02-02 NOTE — Progress Notes (Signed)
Post Partum Day 2 Subjective: no complaints, up ad lib, voiding, tolerating PO and + flatus  Objective: Blood pressure 105/72, pulse 79, temperature 97.8 F (36.6 C), temperature source Rectal, resp. rate 18, height 5\' 4"  (1.626 m), weight 81.647 kg (180 lb), last menstrual period 04/25/2011, SpO2 100.00%, unknown if currently breastfeeding.  Physical Exam:  General: alert and no distress Lochia: appropriate Uterine Fundus: firm Incision: none DVT Evaluation: No evidence of DVT seen on physical exam.   Basename 01/31/12 0722  HGB 11.1*  HCT 34.4*    Assessment/Plan: Discharge home   LOS: 2 days   Guillaume Weninger A 02/02/2012, 8:57 AM

## 2012-08-31 ENCOUNTER — Emergency Department (HOSPITAL_COMMUNITY)
Admission: EM | Admit: 2012-08-31 | Discharge: 2012-08-31 | Disposition: A | Discharge status: Home / Self Care | Payer: Medicaid Other | Attending: Emergency Medicine | Admitting: Emergency Medicine

## 2012-08-31 ENCOUNTER — Encounter (HOSPITAL_COMMUNITY): Payer: Self-pay | Admitting: Emergency Medicine

## 2012-08-31 DIAGNOSIS — Z8719 Personal history of other diseases of the digestive system: Secondary | ICD-10-CM | POA: Insufficient documentation

## 2012-08-31 DIAGNOSIS — Z8619 Personal history of other infectious and parasitic diseases: Secondary | ICD-10-CM | POA: Insufficient documentation

## 2012-08-31 DIAGNOSIS — R21 Rash and other nonspecific skin eruption: Secondary | ICD-10-CM

## 2012-08-31 MED ORDER — PERMETHRIN 5 % EX CREA: TOPICAL_CREAM | CUTANEOUS | Status: DC

## 2012-08-31 NOTE — ED Provider Notes (Signed)
History     CSN: [id number]  Arrival date & time 08/31/12  2023   First MD Initiated Contact with Patient 08/31/12 2057      Chief Complaint  Patient presents with  . Rash    (Consider location/radiation/quality/duration/timing/severity/associated sxs/prior treatment) HPI Comments: Patient presents to the ED with a chief complaint of rash. The rash been persistent for the past several days. She states that her son has the same. She describes is very itchy. She denies any fevers, chills, nausea, or vomiting. She has not tried anything to alleviate her symptoms. Nothing makes her symptoms better or worse.  The history is provided by the patient. No language interpreter was used.    Past Medical History  Diagnosis Date  . Hernia   . Chlamydia     History reviewed. No pertinent past surgical history.  Family History  Problem Relation Age of Onset  . Cancer Mother     History  Substance Use Topics  . Smoking status: Never Smoker   . Smokeless tobacco: Never Used  . Alcohol Use: No    OB History   Grav Para Term Preterm Abortions TAB SAB Ect Mult Living   2 2 2       2       Review of Systems  All other systems reviewed and are negative.    Allergies  Review of patient's allergies indicates no known allergies.  Home Medications   Current Outpatient Rx  Name  Route  Sig  Dispense  Refill  . permethrin (ELIMITE) 5 % cream      Apply to entire body other than face - let sit for 14 hours then wash off, may repeat in 1 week if still having symptoms   60 g   1     BP 128/74  Pulse 87  Temp(Src) 98.4 F (36.9 C) (Oral)  Resp 25  Wt 158 lb (71.668 kg)  BMI 27.11 kg/m2  SpO2 100%  Physical Exam  Nursing note and vitals reviewed. Constitutional: She is oriented to person, place, and time. She appears well-developed and well-nourished.  HENT:  Head: Normocephalic and atraumatic.  Eyes: Conjunctivae and EOM are normal.  Neck: Normal range of motion.   Cardiovascular: Normal rate.   Pulmonary/Chest: Effort normal.  Abdominal: She exhibits no distension.  Musculoskeletal: Normal range of motion.  Neurological: She is alert and oriented to person, place, and time.  Skin: Skin is warm and dry.  Diffuse papular rash characteristic of scabies  Psychiatric: She has a normal mood and affect. Her behavior is normal. Judgment and thought content normal.    ED Course  Procedures (including critical care time)  Labs Reviewed - No data to display No results found.   1. Rash       MDM  Patient with scabies. Will treat with permethrin. Primary care followup. Will treat family members as well. Patient understands and agrees to plan. She is stable for discharge.        Roxy Horseman, PA-C 08/31/12 2111

## 2012-08-31 NOTE — ED Notes (Signed)
Pt states she has a rash on her right forearm, chest and back. States she is worried she has the same rash as her son.

## 2012-09-01 NOTE — ED Provider Notes (Signed)
Medical screening examination/treatment/procedure(s) were conducted as a shared visit with non-physician practitioner(s) and myself.  I personally evaluated the patient during the encounter   Ellard Nan C. Nailyn Dearinger, DO 09/01/12 0059 

## 2013-02-19 ENCOUNTER — Emergency Department (HOSPITAL_COMMUNITY)
Admission: EM | Admit: 2013-02-19 | Discharge: 2013-02-20 | Disposition: A | Discharge status: Home / Self Care | Payer: Medicaid Other | Attending: Emergency Medicine | Admitting: Emergency Medicine

## 2013-02-19 ENCOUNTER — Encounter (HOSPITAL_COMMUNITY): Payer: Self-pay | Admitting: Emergency Medicine

## 2013-02-19 DIAGNOSIS — Z3202 Encounter for pregnancy test, result negative: Secondary | ICD-10-CM | POA: Insufficient documentation

## 2013-02-19 DIAGNOSIS — R197 Diarrhea, unspecified: Secondary | ICD-10-CM | POA: Insufficient documentation

## 2013-02-19 DIAGNOSIS — E860 Dehydration: Secondary | ICD-10-CM

## 2013-02-19 DIAGNOSIS — Z8619 Personal history of other infectious and parasitic diseases: Secondary | ICD-10-CM | POA: Insufficient documentation

## 2013-02-19 DIAGNOSIS — R112 Nausea with vomiting, unspecified: Secondary | ICD-10-CM | POA: Insufficient documentation

## 2013-02-19 DIAGNOSIS — Z8719 Personal history of other diseases of the digestive system: Secondary | ICD-10-CM | POA: Insufficient documentation

## 2013-02-19 DIAGNOSIS — R109 Unspecified abdominal pain: Secondary | ICD-10-CM

## 2013-02-19 DIAGNOSIS — R1013 Epigastric pain: Secondary | ICD-10-CM | POA: Insufficient documentation

## 2013-02-19 DIAGNOSIS — R1110 Vomiting, unspecified: Secondary | ICD-10-CM

## 2013-02-19 LAB — CBC WITH DIFFERENTIAL/PLATELET
Eosinophils Absolute: 0 10*3/uL (ref 0.0–0.7)
HCT: 39.8 % (ref 36.0–46.0)
Hemoglobin: 13 g/dL (ref 12.0–15.0)
Lymphs Abs: 1.5 10*3/uL (ref 0.7–4.0)
MCH: 29.7 pg (ref 26.0–34.0)
Monocytes Absolute: 0.2 10*3/uL (ref 0.1–1.0)
Monocytes Relative: 2 % — ABNORMAL LOW (ref 3–12)
Neutrophils Relative %: 87 % — ABNORMAL HIGH (ref 43–77)
RBC: 4.38 MIL/uL (ref 3.87–5.11)

## 2013-02-19 LAB — COMPREHENSIVE METABOLIC PANEL
Alkaline Phosphatase: 64 U/L (ref 39–117)
BUN: 7 mg/dL (ref 6–23)
Chloride: 104 mEq/L (ref 96–112)
Creatinine, Ser: 0.64 mg/dL (ref 0.50–1.10)
GFR calc Af Amer: >90 mL/min (ref >90)
Glucose, Bld: 115 mg/dL — ABNORMAL HIGH (ref 70–99)
Potassium: 4.1 mEq/L (ref 3.5–5.1)
Total Bilirubin: 0.6 mg/dL (ref 0.3–1.2)
Total Protein: 8.6 g/dL — ABNORMAL HIGH (ref 6.0–8.3)

## 2013-02-19 LAB — URINALYSIS, ROUTINE W REFLEX MICROSCOPIC
Ketones, ur: >80 mg/dL — AB
Leukocytes, UA: NEGATIVE
Nitrite: NEGATIVE
Protein, ur: 30 mg/dL — AB

## 2013-02-19 MED ORDER — TRAMADOL HCL 50 MG PO TABS: 50.0000 mg | ORAL_TABLET | Freq: Four times a day (QID) | ORAL | Status: DC | PRN

## 2013-02-19 MED ORDER — ONDANSETRON 4 MG PO TBDP
8.0000 mg | ORAL_TABLET | Freq: Once | ORAL | Status: AC
  Administered 2013-02-19: 8 mg via ORAL
  Filled 2013-02-19: qty 2

## 2013-02-19 MED ORDER — ONDANSETRON HCL 4 MG PO TABS: 4.0000 mg | ORAL_TABLET | Freq: Four times a day (QID) | ORAL | Status: DC

## 2013-02-19 MED ORDER — SODIUM CHLORIDE 0.9 % IV BOLUS (SEPSIS)
1000.0000 mL | Freq: Once | INTRAVENOUS | Status: AC
  Administered 2013-02-20: 1000 mL via INTRAVENOUS

## 2013-02-19 MED ORDER — FAMOTIDINE IN NACL 20-0.9 MG/50ML-% IV SOLN
20.0000 mg | Freq: Once | INTRAVENOUS | Status: AC
  Administered 2013-02-20: 20 mg via INTRAVENOUS
  Filled 2013-02-19: qty 50

## 2013-02-19 NOTE — ED Provider Notes (Signed)
CSN: [id number]     Arrival date & time 02/19/13  1933 History   First MD Initiated Contact with Patient 02/19/13 2309     Chief Complaint  Patient presents with  . Abdominal Pain   (Consider location/radiation/quality/duration/timing/severity/associated sxs/prior Treatment) HPI Patient is a generally healthy young woman who presents with nausea, vomiting, diarrhea and abdominal pain. Her sx began around 1300 today. She has vomited about 5-6 times - vomitus has been NBNB. The patient has had three episodes of nonbloody stools. She has been able to tolerate liquid po intake. She denies fever.   Her abdominal pain localizes to the epigastric region. Pain is intermittent, cramping and sharp but, has resolved since the patient has been waiting to be seen in the ED.  Pain 7/10 at greatest severity. No excacerbating or relieving factors noted.   The patient says that her two young chidren  - ages 1 and 3 - have both been ill with diarrhea. She denies recent international travel.   Past Medical History  Diagnosis Date  . Hernia   . Chlamydia    History reviewed. No pertinent past surgical history. Family History  Problem Relation Age of Onset  . Cancer Mother    History  Substance Use Topics  . Smoking status: Never Smoker   . Smokeless tobacco: Never Used  . Alcohol Use: No   OB History   Grav Para Term Preterm Abortions TAB SAB Ect Mult Living   2 2 2       2      Review of Systems 10 point ROS performed and is negative with the exception of sx noted above.    Allergies  Review of patient's allergies indicates no known allergies.  Home Medications   Current Outpatient Rx  Name  Route  Sig  Dispense  Refill  . acetaminophen (TYLENOL) 500 MG tablet   Oral   Take 1,000 mg by mouth every 6 (six) hours as needed for pain.          BP 112/73  Pulse 86  Temp(Src) 98.1 F (36.7 C) (Oral)  Resp 16  Ht 5\' 1"  (1.549 m)  Wt 153 lb (69.4 kg)  BMI 28.92 kg/m2  SpO2  100% Physical Exam Gen: well developed and well nourished appearing Head: NCAT Eyes: PERL, EOMI Nose: no epistaixis or rhinorrhea Mouth/throat: mucosa is moist and pink Neck: supple, no stridor Lungs: CTA B, no wheezing, rhonchi or rales ED: Regular rate and rhythm. No murmur, extremities well perfused Abd: soft, notender, nondistended Back: no ttp, no cva ttp Skin: no rashese, wnl, warm and dry Neuro: CN ii-xii grossly intact, no focal deficits Psyche; normal affect,  calm and cooperative.   ED Course  Procedures (including critical care time) Labs Review  Results for orders placed during the hospital encounter of 02/19/13 (from the past 24 hour(s))  CBC WITH DIFFERENTIAL     Status: Abnormal   Collection Time    02/19/13  7:41 PM      Result Value Range   WBC 13.7 (*) 4.0 - 10.5 K/uL   RBC 4.38  3.87 - 5.11 MIL/uL   Hemoglobin 13.0  12.0 - 15.0 g/dL   HCT 02.7  25.3 - 66.4 %   MCV 90.9  78.0 - 100.0 fL   MCH 29.7  26.0 - 34.0 pg   MCHC 32.7  30.0 - 36.0 g/dL   RDW 40.3  47.4 - 25.9 %   Platelets 397  150 - 400 K/uL  Neutrophils Relative % 87 (*) 43 - 77 %   Neutro Abs 11.9 (*) 1.7 - 7.7 K/uL   Lymphocytes Relative 11 (*) 12 - 46 %   Lymphs Abs 1.5  0.7 - 4.0 K/uL   Monocytes Relative 2 (*) 3 - 12 %   Monocytes Absolute 0.2  0.1 - 1.0 K/uL   Eosinophils Relative 0  0 - 5 %   Eosinophils Absolute 0.0  0.0 - 0.7 K/uL   Basophils Relative 0  0 - 1 %   Basophils Absolute 0.0  0.0 - 0.1 K/uL  COMPREHENSIVE METABOLIC PANEL     Status: Abnormal   Collection Time    02/19/13  7:41 PM      Result Value Range   Sodium 137  135 - 145 mEq/L   Potassium 4.1  3.5 - 5.1 mEq/L   Chloride 104  96 - 112 mEq/L   CO2 21  19 - 32 mEq/L   Glucose, Bld 115 (*) 70 - 99 mg/dL   BUN 7  6 - 23 mg/dL   Creatinine, Ser 1.61  0.50 - 1.10 mg/dL   Calcium 9.4  8.4 - 09.6 mg/dL   Total Protein 8.6 (*) 6.0 - 8.3 g/dL   Albumin 3.7  3.5 - 5.2 g/dL   AST 15  0 - 37 U/L   ALT 5  0 - 35 U/L    Alkaline Phosphatase 64  39 - 117 U/L   Total Bilirubin 0.6  0.3 - 1.2 mg/dL   GFR calc non Af Amer >90  >90 mL/min   GFR calc Af Amer >90  >90 mL/min  URINALYSIS, ROUTINE W REFLEX MICROSCOPIC     Status: Abnormal   Collection Time    02/19/13  9:45 PM      Result Value Range   Color, Urine AMBER (*) YELLOW   APPearance CLEAR  CLEAR   Specific Gravity, Urine 1.033 (*) 1.005 - 1.030   pH 6.5  5.0 - 8.0   Glucose, UA NEGATIVE  NEGATIVE mg/dL   Hgb urine dipstick NEGATIVE  NEGATIVE   Bilirubin Urine NEGATIVE  NEGATIVE   Ketones, ur >80 (*) NEGATIVE mg/dL   Protein, ur 30 (*) NEGATIVE mg/dL   Urobilinogen, UA 1.0  0.0 - 1.0 mg/dL   Nitrite NEGATIVE  NEGATIVE   Leukocytes, UA NEGATIVE  NEGATIVE  URINE MICROSCOPIC-ADD ON     Status: Abnormal   Collection Time    02/19/13  9:45 PM      Result Value Range   Squamous Epithelial / LPF FEW (*) RARE   WBC, UA 0-2  <3 WBC/hpf   RBC / HPF 0-2  <3 RBC/hpf   Bacteria, UA RARE  RARE   Urine-Other MUCOUS PRESENT    POCT PREGNANCY, URINE     Status: None   Collection Time    02/19/13  9:53 PM      Result Value Range   Preg Test, Ur NEGATIVE  NEGATIVE    EKG Interpretation   None       MDM  Patient with approximately 10 hrs of nausea, vomiting, diarrhea and epiagastric cramping pain. Sx have essentially resolved. The patient is asmptomatic at this time. History  And sx suggest viral enteritis. However, urinalysis with > 80 ketones suggest dehydration. We will tx with IVF. Patient has received Zofran and wishes to get some IVF along with po trial. Anticipate d/c home.     Brandt Loosen, MD 02/19/13 [phone]

## 2013-02-19 NOTE — ED Notes (Signed)
Pt CO sharp abd pain that is intermitted in her left lower quadrant

## 2013-02-20 MED ORDER — TRAMADOL HCL 50 MG PO TABS
50.0000 mg | ORAL_TABLET | Freq: Once | ORAL | Status: AC
  Administered 2013-02-20: 50 mg via ORAL
  Filled 2013-02-20: qty 1

## 2013-08-05 DIAGNOSIS — K432 Incisional hernia without obstruction or gangrene: Secondary | ICD-10-CM | POA: Insufficient documentation

## 2013-08-05 DIAGNOSIS — Z8619 Personal history of other infectious and parasitic diseases: Secondary | ICD-10-CM | POA: Insufficient documentation

## 2013-08-06 ENCOUNTER — Emergency Department (HOSPITAL_COMMUNITY)
Admission: EM | Admit: 2013-08-06 | Discharge: 2013-08-06 | Disposition: A | Discharge status: Home / Self Care | Payer: Medicaid Other | Attending: Emergency Medicine | Admitting: Emergency Medicine

## 2013-08-06 ENCOUNTER — Encounter (HOSPITAL_COMMUNITY): Payer: Self-pay | Admitting: Emergency Medicine

## 2013-08-06 DIAGNOSIS — K432 Incisional hernia without obstruction or gangrene: Secondary | ICD-10-CM

## 2013-08-06 MED ORDER — IBUPROFEN 200 MG PO TABS
600.0000 mg | ORAL_TABLET | Freq: Once | ORAL | Status: AC
  Administered 2013-08-06: 600 mg via ORAL
  Filled 2013-08-06: qty 3

## 2013-08-06 MED ORDER — IBUPROFEN 600 MG PO TABS: 600.0000 mg | ORAL_TABLET | Freq: Four times a day (QID) | ORAL | Status: AC

## 2013-08-06 MED ORDER — HYDROCODONE-ACETAMINOPHEN 5-325 MG PO TABS: 1.0000 | ORAL_TABLET | Freq: Four times a day (QID) | ORAL | Status: DC | PRN

## 2013-08-06 MED ORDER — HYDROCODONE-ACETAMINOPHEN 5-325 MG PO TABS
1.0000 | ORAL_TABLET | Freq: Once | ORAL | Status: AC
  Administered 2013-08-06: 1 via ORAL
  Filled 2013-08-06: qty 1

## 2013-08-06 NOTE — ED Notes (Signed)
The pt has a hernia that she has had for 5 years.  It is large mid-abd.  She reports that she wears a brace but that has not helped this time.  She is having  A lot of pain

## 2013-08-06 NOTE — ED Notes (Signed)
Ortho at bedside applying Abdominal binder.

## 2013-08-06 NOTE — Discharge Instructions (Signed)
Hernia A hernia occurs when an internal organ pushes out through a weak spot in the abdominal wall. Hernias most commonly occur in the groin and around the navel. Hernias often can be pushed back into place (reduced). Most hernias tend to get worse over time. Some abdominal hernias can get stuck in the opening (irreducible or incarcerated hernia) and cannot be reduced. An irreducible abdominal hernia which is tightly squeezed into the opening is at risk for impaired blood supply (strangulated hernia). A strangulated hernia is a medical emergency. Because of the risk for an irreducible or strangulated hernia, surgery may be recommended to repair a hernia. CAUSES   Heavy lifting.  Prolonged coughing.  Straining to have a bowel movement.  A cut (incision) made during an abdominal surgery. HOME CARE INSTRUCTIONS   Bed rest is not required. You may continue your normal activities.  Avoid lifting more than 10 pounds (4.5 kg) or straining.  Cough gently. If you are a smoker it is best to stop. Even the best hernia repair can break down with the continual strain of coughing. Even if you do not have your hernia repaired, a cough will continue to aggravate the problem.  Do not wear anything tight over your hernia. Do not try to keep it in with an outside bandage or truss. These can damage abdominal contents if they are trapped within the hernia sac.  Eat a normal diet.  Avoid constipation. Straining over long periods of time will increase hernia size and encourage breakdown of repairs. If you cannot do this with diet alone, stool softeners may be used. SEEK IMMEDIATE MEDICAL CARE IF:   You have a fever.  You develop increasing abdominal pain.  You feel nauseous or vomit.  Your hernia is stuck outside the abdomen, looks discolored, feels hard, or is tender.  You have any changes in your bowel habits or in the hernia that are unusual for you.  You have increased pain or swelling around the  hernia.  You cannot push the hernia back in place by applying gentle pressure while lying down. MAKE SURE YOU:   Understand these instructions.  Will watch your condition.  Will get help right away if you are not doing well or get worse. Document Released: 04/19/2005 Document Revised: 07/12/2011 Document Reviewed: 12/07/2007 [facility] Patient Information 2014 [employer], [place]. Tonight/this morning.  Your hernia was easily reduced in the future.  If your hernia.  Out and becomes painful.  Put a pillow under your buttock.  If your head, lower, applied ice and gentle pressure.  If your hernia cannot be reduced.  This is atrial emergency to come to the emergency department for further treatment is in, your best interest to have this repaired, You have  been [place] [facility] [place]

## 2013-08-06 NOTE — ED Notes (Signed)
PA at bedside.

## 2013-08-06 NOTE — ED Provider Notes (Signed)
CSN: [id number]     Arrival date & time 08/05/13  2351 History   First MD Initiated Contact with Patient 08/06/13 0440     Chief Complaint  Patient presents with  . Hernia     (Consider location/radiation/quality/duration/timing/severity/associated sxs/prior Treatment) HPI Comments: Patient with known small ventral, hernia.  For the past 5 years.  She's been afraid to have it surgically repaired.  She states for the past 12, hours.  It's been more painful than normal.  She normally wears a abdominal binder to keep it in place.  Its been tender to touch.  She denies any fever, nausea, vomiting, constipation.  She has not taken any medication or just applied ice or heat  The history is provided by the patient.    Past Medical History  Diagnosis Date  . Hernia   . Chlamydia    History reviewed. No pertinent past surgical history. Family History  Problem Relation Age of Onset  . Cancer Mother    History  Substance Use Topics  . Smoking status: Never Smoker   . Smokeless tobacco: Never Used  . Alcohol Use: No   OB History   Grav Para Term Preterm Abortions TAB SAB Ect Mult Living   2 2 2       2      Review of Systems  Constitutional: Negative for fever.  Respiratory: Negative for shortness of breath.   Gastrointestinal: Positive for abdominal pain. Negative for nausea, vomiting, diarrhea and constipation.  All other systems reviewed and are negative.      Allergies  Review of patient's allergies indicates no known allergies.  Home Medications   Current Outpatient Rx  Name  Route  Sig  Dispense  Refill  . acetaminophen (TYLENOL) 500 MG tablet   Oral   Take 1,000 mg by mouth every 6 (six) hours as needed for pain.         Marland Kitchen. HYDROcodone-acetaminophen (NORCO/VICODIN) 5-325 MG per tablet   Oral   Take 1 tablet by mouth every 6 (six) hours as needed for moderate pain.   9 tablet   0   . ibuprofen (ADVIL,MOTRIN) 600 MG tablet   Oral   Take 1 tablet (600 mg total)  by mouth QID.   30 tablet   0     After the 9th take the ibuprofen as needed    BP 127/43  Pulse 55  Temp(Src) 98.3 F (36.8 C) (Oral)  Resp 17  Ht 5\' 1"  (1.549 m)  Wt 154 lb 1 oz (69.882 kg)  BMI 29.12 kg/m2  SpO2 100%  LMP 07/06/2013 Physical Exam  Nursing note and vitals reviewed. Constitutional: She appears well-developed and well-nourished.  HENT:  Head: Normocephalic.  Eyes: Pupils are equal, round, and reactive to light.  Neck: Normal range of motion.  Cardiovascular: Normal rate and regular rhythm.   Pulmonary/Chest: Effort normal and breath sounds normal.  Abdominal: Soft. She exhibits no distension. There is tenderness. A hernia is present. Hernia confirmed positive in the ventral area.      ED Course  Procedures (including critical care time) Labs Review Labs Reviewed - No data to display Imaging Review No results found.   EKG Interpretation None      MDM  Patient was placed in Trendelenburg position, and ice applied.  Hernia, has been reduced to a stain.  This position for short period of time.  She does have her abdominal binder with her .  This will be  placed before she  attempts ambulation Final diagnoses:  Ventral hernia, recurrent         Arman Filter, NP 08/06/13 0544  Arman Filter, NP 08/08/13 1954

## 2013-08-06 NOTE — ED Notes (Signed)
Pt is in the bed flat with head of bed down and feet elevated.

## 2013-08-06 NOTE — ED Notes (Signed)
MD at Bedside.

## 2013-08-08 NOTE — ED Provider Notes (Signed)
Medical screening examination/treatment/procedure(s) were conducted as a shared visit with non-physician practitioner(s) and myself.  I personally evaluated the patient during the encounter.  ABD hernia, reduced by MLP prior to my evaluation. No acute ABD on exam is soft, NT/ ND. Plan outpatient follow up with return precautions and instructions provided    Sunnie NielsenBrian Tahara Ruffini, MD 08/08/13 2345

## 2014-03-04 ENCOUNTER — Encounter (HOSPITAL_COMMUNITY): Payer: Self-pay | Admitting: Emergency Medicine

## 2016-01-15 ENCOUNTER — Ambulatory Visit: Payer: Self-pay | Admitting: Obstetrics

## 2016-04-20 ENCOUNTER — Other Ambulatory Visit (HOSPITAL_COMMUNITY)
Admission: RE | Admit: 2016-04-20 | Discharge: 2016-04-20 | Disposition: A | Discharge status: Home / Self Care | Payer: Medicaid Other | Source: Ambulatory Visit | Attending: Obstetrics | Admitting: Obstetrics

## 2016-04-20 ENCOUNTER — Encounter: Payer: Self-pay | Admitting: Obstetrics

## 2016-04-20 ENCOUNTER — Ambulatory Visit (INDEPENDENT_AMBULATORY_CARE_PROVIDER_SITE_OTHER): Payer: Medicaid Other | Admitting: Obstetrics

## 2016-04-20 VITALS — BP 110/76 | HR 80 | Temp 97.9°F | Ht 61.0 in | Wt 156.4 lb

## 2016-04-20 DIAGNOSIS — Z0000 Encounter for general adult medical examination without abnormal findings: Secondary | ICD-10-CM | POA: Diagnosis not present

## 2016-04-20 DIAGNOSIS — Z3009 Encounter for other general counseling and advice on contraception: Secondary | ICD-10-CM

## 2016-04-20 DIAGNOSIS — K439 Ventral hernia without obstruction or gangrene: Secondary | ICD-10-CM

## 2016-04-20 DIAGNOSIS — Z30011 Encounter for initial prescription of contraceptive pills: Secondary | ICD-10-CM

## 2016-04-20 DIAGNOSIS — Z01419 Encounter for gynecological examination (general) (routine) without abnormal findings: Secondary | ICD-10-CM

## 2016-04-20 MED ORDER — LO LOESTRIN FE 1 MG-10 MCG / 10 MCG PO TABS: 1.0000 | ORAL_TABLET | Freq: Every day | ORAL | 4 refills | Status: DC

## 2016-04-20 NOTE — Progress Notes (Signed)
Subjective:        Darlene [name] is a 30 y.o. female here for a routine exam.  Current complaints: Hernia that is painful at times..    Personal health questionnaire:  Is patient Darlene [name], have a family history of breast and/or ovarian cancer: no Is there a family history of uterine cancer diagnosed at age < 8050, gastrointestinal cancer, urinary tract cancer, family member who is a Personnel officerLynch syndrome-associated carrier: no Is the patient overweight and hypertensive, family history of diabetes, personal history of gestational diabetes, preeclampsia or PCOS: no Is patient over 8555, have PCOS,  family history of premature CHD under age 28, diabetes, smoke, have hypertension or peripheral artery disease:  no At any time, has a partner hit, kicked or otherwise hurt or frightened you?: no Over the past 2 weeks, have you felt down, depressed or hopeless?: no Over the past 2 weeks, have you felt little interest or pleasure in doing things?:no   Gynecologic History Patient's last menstrual period was 03/21/2016 (exact date). Contraception: none Last Pap: unknown. Results were: unknown Last mammogram: n/a. Results were: n/a  Obstetric History OB History  Gravida Para Term Preterm AB Living  2 2 2     2   SAB TAB Ectopic Multiple Live Births          1    # Outcome Date GA Lbr Len/2nd Weight Sex Delivery Anes PTL Lv  2 Term 01/31/12 [id number] 04:37 / 01:25 6 lb 11.1 oz (3.036 kg) F Vag-Spont Local, EPI  LIV  1 Term               Past Medical History:  Diagnosis Date  . Chlamydia   . Hernia     History reviewed. No pertinent surgical history.   Current Outpatient Prescriptions:  .  acetaminophen (TYLENOL) 500 MG tablet, Take 1,000 mg by mouth every 6 (six) hours as needed for pain., Disp: , Rfl:  .  HYDROcodone-acetaminophen (NORCO/VICODIN) 5-325 MG per tablet, Take 1 tablet by mouth every 6 (six) hours as needed for moderate pain. (Patient not taking: Reported on 04/20/2016),  Disp: 9 tablet, Rfl: 0 .  LO LOESTRIN FE 1 MG-10 MCG / 10 MCG tablet, Take 1 tablet by mouth daily., Disp: 3 Package, Rfl: 4 No Known Allergies  Social History  Substance Use Topics  . Smoking status: Never Smoker  . Smokeless tobacco: Never Used  . Alcohol use No    Family History  Problem Relation Age of Onset  . Cancer Mother       Review of Systems  Constitutional: negative for fatigue and weight loss Respiratory: negative for cough and wheezing Cardiovascular: negative for chest pain, fatigue and palpitations Gastrointestinal: positive for painful abdominal wall hernia Musculoskeletal:negative for myalgias Neurological: negative for gait problems and tremors Behavioral/Psych: negative for abusive relationship, depression Endocrine: negative for temperature intolerance    Genitourinary:negative for abnormal menstrual periods, genital lesions, hot flashes, sexual problems and vaginal discharge Integument/breast: negative for breast lump, breast tenderness, nipple discharge and skin lesion(s)    Objective:       BP 110/76   Pulse 80   Temp 97.9 F (36.6 C)   Ht 5\' 1"  (1.549 m)   Wt 156 lb 6.4 oz (70.9 kg)   LMP 03/21/2016 (Exact Date)   Breastfeeding? No   BMI 29.55 kg/m  General:   alert  Skin:   no rash or abnormalities  Lungs:   clear to auscultation bilaterally  Heart:  regular rate and rhythm, S1, S2 normal, no murmur, click, rub or gallop  Breasts:   normal without suspicious masses, skin or nipple changes or axillary nodes  Abdomen:  abdominal wall hernia, no bowel incarceration   Pelvis:  External genitalia: normal general appearance Urinary system: urethral meatus normal and bladder without fullness, nontender Vaginal: normal without tenderness, induration or masses Cervix: normal appearance Adnexa: normal bimanual exam Uterus: anteverted and non-tender, normal size   Lab Review Urine pregnancy test Labs reviewed yes Radiologic studies reviewed  no  50% of 20 min visit spent on counseling and coordination of care.    Assessment:    Healthy female exam.    Abdominal wall hernia  Contraceptive Counseling and Advice   Plan:    Referred to General Surgery for hernia evaluation  Lo Loestrin Fe Rx  Education reviewed: low fat, low cholesterol diet, safe sex/STD prevention, self breast exams and weight bearing exercise. Contraception: OCP (estrogen/progesterone). Follow up in: 1 year.   Meds ordered this encounter  Medications  . LO LOESTRIN FE 1 MG-10 MCG / 10 MCG tablet    Sig: Take 1 tablet by mouth daily.    Dispense:  3 Package    Refill:  4    Submit other coverage code 3  BIN:  [id number]  PCN:  [id number]   GRP:  [id number]   :  [id number]:  [id number]   Orders Placed This Encounter  Procedures  . NuSwab Vaginitis Plus (VG+)  . Ambulatory referral to General Surgery    Referral Priority:   Routine    Referral Type:   Surgical    Referral Reason:   Specialty Services Required    Requested Specialty:   General Surgery    Number of Visits Requested:   1

## 2016-04-21 LAB — CYTOLOGY - PAP: DIAGNOSIS: NEGATIVE

## 2016-04-24 LAB — NUSWAB VAGINITIS PLUS (VG+)
CANDIDA GLABRATA, NAA: NEGATIVE
Candida albicans, NAA: NEGATIVE
Chlamydia trachomatis, NAA: NEGATIVE
NEISSERIA GONORRHOEAE, NAA: NEGATIVE
TRICH VAG BY NAA: NEGATIVE

## 2017-04-04 ENCOUNTER — Encounter (HOSPITAL_COMMUNITY): Payer: Self-pay | Admitting: Emergency Medicine

## 2017-04-04 ENCOUNTER — Emergency Department (HOSPITAL_COMMUNITY)
Admission: EM | Admit: 2017-04-04 | Discharge: 2017-04-04 | Disposition: A | Discharge status: Home / Self Care | Payer: Self-pay | Attending: Emergency Medicine | Admitting: Emergency Medicine

## 2017-04-04 ENCOUNTER — Other Ambulatory Visit: Payer: Self-pay

## 2017-04-04 DIAGNOSIS — J039 Acute tonsillitis, unspecified: Secondary | ICD-10-CM | POA: Insufficient documentation

## 2017-04-04 LAB — RAPID STREP SCREEN (MED CTR MEBANE ONLY): Streptococcus, Group A Screen (Direct): NEGATIVE

## 2017-04-04 MED ORDER — NAPROXEN 500 MG PO TABS: 500.0000 mg | ORAL_TABLET | Freq: Two times a day (BID) | ORAL | 0 refills | Status: DC

## 2017-04-04 MED ORDER — IBUPROFEN 400 MG PO TABS
600.0000 mg | ORAL_TABLET | Freq: Once | ORAL | Status: AC
  Administered 2017-04-04: 600 mg via ORAL
  Filled 2017-04-04: qty 1

## 2017-04-04 MED ORDER — AMOXICILLIN 500 MG PO CAPS: 500.0000 mg | ORAL_CAPSULE | Freq: Two times a day (BID) | ORAL | 0 refills | Status: DC

## 2017-04-04 MED ORDER — PREDNISONE 10 MG PO TABS: 30.0000 mg | ORAL_TABLET | Freq: Two times a day (BID) | ORAL | 0 refills | Status: AC

## 2017-04-04 MED ORDER — LIDOCAINE VISCOUS 2 % MT SOLN: 15.0000 mL | OROMUCOSAL | 0 refills | Status: DC | PRN

## 2017-04-04 NOTE — ED Notes (Signed)
EDP aware patient requesting pain medicine

## 2017-04-04 NOTE — Discharge Instructions (Signed)
Please read and follow all provided instructions.  Your diagnoses today include: tonsillitis  Tests performed today include: Vital signs. See below for your results today.   Medications prescribed:  Please take all of your antibiotics until finished!  It is very important that you complete the entire course of this medication or the strep may not completely be treated.  You may develop abdominal discomfort or diarrhea from the antibiotic.  You may help offset this with probiotics which you can buy or get in yogurt. Do not eat or take the probiotics until 2 hours after your antibiotic. Do not take your medicine if develop an itchy rash, swelling in your mouth or lips, or difficulty breathing.  Take prednisone as prescribed.  This medication may cause irritability, poor sleep and weight gain.  If your diabetic can also increase her blood sugar. Please take viscous lidocaine as needed for pain. Please rinse, gargle and spit as directed.  Please take naproxen as directed with food for body aches, and fever.   Home care instructions:  This is a bacterial infection. Continue to stay well-hydrated. Gargle warm salt water and spit it out. Continued to alternate between Tylenol and ibuprofen for pain  may consider over-the-counter Benadryl for additional relief (decrease secretions). Also discard your toothbrush and begin using a new one in 3 days. Follow-up with your primary care doctor in this week for recheck of ongoing symptoms.   Follow-up instructions: Please follow-up with your primary care provider in 2-3 days for follow up.    Return instructions:  Return to the ED sooner for worsening condition, inability to swallow, breathing difficulty, new concerns.  Additional Information:  Your vital signs today were: BP 112/80    Pulse 99    Temp 98.7 F (37.1 C) (Oral)    Resp 18    Ht 5\' 1"  (1.549 m)    Wt 72.6 kg (160 lb)    LMP 03/05/2017 (Exact Date)    SpO2 95%    BMI 30.23 kg/m  If your  blood pressure (BP) was elevated above 135/85 this visit, please have this repeated by your doctor within one month. ---------------

## 2017-04-04 NOTE — ED Provider Notes (Signed)
[facility]   CSN: [id number] Arrival date & time: 04/04/17  [id number]     History   Chief Complaint Chief Complaint  Patient presents with  . Generalized Body Aches    HPI Darlene [name] is a 31 y.o. female who presents to the ED today for sore throat x 3 days.  Patient states that on Friday evening started feeling a dry, scratchy throat.  When she awoke on Saturday morning she noticed pain in the back of her throat with associated dysphasia.  Since then the patient has been having generalized body aches as well. She has been taking tylenol at home for this without any relief.  Patient denies any fever, chills, sinus pressure, sinus pain, rhinorrhea, ear pain, ear pressure, inability to control secretions, nausea, vomiting, abdominal pain, cough, hemoptysis, voice change, dental disease, trauma, chest pain, shortness of breath, abdominal pain. Patient did not have flu shot this year. No sick contacts.   HPI  Past Medical History:  Diagnosis Date  . Chlamydia   . Hernia     Patient Active Problem List   Diagnosis Date Noted  . Active labor 01/31/2012  . Normal delivery 01/31/2012    History reviewed. No pertinent surgical history.  OB History    Gravida Para Term Preterm AB Living   2 2 2     2    SAB TAB Ectopic Multiple Live Births           1       Home Medications    Prior to Admission medications   Medication Sig Start Date End Date Taking? Authorizing Provider  acetaminophen (TYLENOL) 500 MG tablet Take 1,000 mg by mouth every 6 (six) hours as needed for pain.   Yes [provider]  HYDROcodone-acetaminophen (NORCO/VICODIN) 5-325 MG per tablet Take 1 tablet by mouth every 6 (six) hours as needed for moderate pain. Patient not taking: Reported on 04/20/2016 08/06/13   Earley FavorSchulz, Gail, NP  LO LOESTRIN FE 1 MG-10 MCG / 10 MCG tablet Take 1 tablet by mouth daily. Patient not taking: Reported on 04/04/2017  04/20/16   Brock BadHarper, Charles A, MD    Family History Family History  Problem Relation Age of Onset  . Cancer Mother     Social History Social History   Tobacco Use  . Smoking status: Never Smoker  . Smokeless tobacco: Never Used  Substance Use Topics  . Alcohol use: No    Alcohol/week: 0.6 oz    Types: 1 Glasses of wine per week  . Drug use: No     Allergies   Patient has no known allergies.   Review of Systems Review of Systems  Constitutional: Negative for chills and fever.  HENT: Positive for sore throat. Negative for congestion, dental problem, drooling, ear discharge, ear pain, facial swelling, postnasal drip, rhinorrhea, sinus pressure, sinus pain, sneezing, trouble swallowing and voice change.   Eyes: Negative for visual disturbance.  Respiratory: Negative for cough, chest tightness and shortness of breath.   Cardiovascular: Negative for chest pain.  Gastrointestinal: Negative for abdominal distention, abdominal pain, diarrhea and nausea.  Genitourinary: Negative for difficulty urinating, dysuria, flank pain, frequency, hematuria and urgency.  Musculoskeletal: Positive for myalgias. Negative for arthralgias and neck stiffness.  Skin: Negative for rash.  Neurological: Negative for headaches.  All other systems reviewed and are negative.    Physical Exam Updated Vital Signs BP (!) 115/98   Pulse 95   Temp 99.8 F (  37.7 C) (Oral)   Resp 18   Ht 5\' 1"  (1.549 m)   Wt 72.6 kg (160 lb)   LMP 03/05/2017 (Exact Date)   SpO2 98%   BMI 30.23 kg/m   Physical Exam  Constitutional: She appears well-developed and well-nourished.  HENT:  Head: Normocephalic and atraumatic.  Right Ear: External ear normal.  Left Ear: External ear normal.  Nose: Nose normal.  Mouth/Throat: Uvula is midline, oropharynx is clear and moist and mucous membranes are normal. No tonsillar exudate.  The patient has normal phonation and is in control of secretions. No stridor.  Midline  uvula without edema. Soft palate rises symmetrically. Tonsillar erythema and exudates bilaterally. No PTA. Tongue protrusion is normal. No trismus. No creptius on neck palpation and patient has good dentition. No gingival erythema or fluctuance noted. Mucus membranes moist.   Eyes: Pupils are equal, round, and reactive to light. Right eye exhibits no discharge. Left eye exhibits no discharge. No scleral icterus.  Neck: Trachea normal. Neck supple. No spinous process tenderness present. No neck rigidity. Normal range of motion present.  No meningismus  Cardiovascular: Normal rate, regular rhythm and intact distal pulses.  No murmur heard. Pulses:      Radial pulses are 2+ on the right side, and 2+ on the left side.       Dorsalis pedis pulses are 2+ on the right side, and 2+ on the left side.       Posterior tibial pulses are 2+ on the right side, and 2+ on the left side.  No lower extremity swelling or edema. Calves symmetric in size bilaterally.  Pulmonary/Chest: Effort normal and breath sounds normal. She exhibits no tenderness.  Abdominal: Soft. Bowel sounds are normal. There is no tenderness. There is no rebound and no guarding.  Musculoskeletal: She exhibits no edema.  Lymphadenopathy:    She has no cervical adenopathy.  Neurological: She is alert.  Skin: Skin is warm and dry. No rash noted. She is not diaphoretic.  Psychiatric: She has a normal mood and affect.  Nursing note and vitals reviewed.    ED Treatments / Results  Labs (all labs ordered are listed, but only abnormal results are displayed) Labs Reviewed  RAPID STREP SCREEN (NOT AT [facility])    EKG  EKG Interpretation None       Radiology No results found.  Procedures Procedures (including critical care time)  Medications Ordered in ED Medications  ibuprofen (ADVIL,MOTRIN) tablet 600 mg (not administered)     Initial Impression / Assessment and Plan / ED Course  I have reviewed the triage vital signs and the  nursing notes.  Pertinent labs & imaging results that were available during my care of the patient were reviewed by me and considered in my medical decision making (see chart for details).     Pt with low grade temperature (99.8) with tonsillar exudate, cervical lymphadenopathy, & dysphagia; diagnosis of bacterial pharyngitis. Patient will be discharged with prednisone, NSAIDs, viscous lidocaine and amoxicillin. Offered PCN IM.  Pt appears mildly dehydrated, discussed importance of water rehydration. Presentation non concerning for PTA or RPA. No trismus or uvula deviation. Specific return precautions discussed. Pt able to drink water in ED without difficulty with intact air way. Recommended PCP follow up.   Final Clinical Impressions(s) / ED Diagnoses   Final diagnoses:  Tonsillitis    ED Discharge Orders        Ordered    naproxen (NAPROSYN) 500 MG tablet  2 times daily  04/04/17 1404    lidocaine (XYLOCAINE) 2 % solution  As needed     04/04/17 1404    amoxicillin (AMOXIL) 500 MG capsule  2 times daily     04/04/17 1404    predniSONE (DELTASONE) 10 MG tablet  2 times daily with meals     04/04/17 1412       Princella Pellegrini 04/04/17 1413    Linwood Dibbles, MD 04/05/17 [phone]

## 2017-04-04 NOTE — ED Notes (Signed)
Patient given water to drink.  

## 2017-04-04 NOTE — ED Triage Notes (Signed)
Patient complains of sore throat and generalized body aches x3 days. Denies being around anyone that was sick recently, did not receive flu shot this year.

## 2017-04-06 LAB — CULTURE, GROUP A STREP (THRC)

## 2019-05-28 ENCOUNTER — Other Ambulatory Visit: Payer: Self-pay

## 2019-05-28 ENCOUNTER — Encounter (HOSPITAL_COMMUNITY): Payer: Self-pay | Admitting: Emergency Medicine

## 2019-05-28 ENCOUNTER — Emergency Department (HOSPITAL_COMMUNITY)
Admission: EM | Admit: 2019-05-28 | Discharge: 2019-05-28 | Disposition: A | Discharge status: Home / Self Care | Payer: No Typology Code available for payment source | Attending: Emergency Medicine | Admitting: Emergency Medicine

## 2019-05-28 ENCOUNTER — Emergency Department (HOSPITAL_COMMUNITY): Payer: No Typology Code available for payment source

## 2019-05-28 DIAGNOSIS — Y999 Unspecified external cause status: Secondary | ICD-10-CM | POA: Diagnosis not present

## 2019-05-28 DIAGNOSIS — Z79899 Other long term (current) drug therapy: Secondary | ICD-10-CM | POA: Insufficient documentation

## 2019-05-28 DIAGNOSIS — M542 Cervicalgia: Secondary | ICD-10-CM | POA: Diagnosis not present

## 2019-05-28 DIAGNOSIS — Y92410 Unspecified street and highway as the place of occurrence of the external cause: Secondary | ICD-10-CM | POA: Diagnosis not present

## 2019-05-28 DIAGNOSIS — R519 Headache, unspecified: Secondary | ICD-10-CM | POA: Insufficient documentation

## 2019-05-28 DIAGNOSIS — Y939 Activity, unspecified: Secondary | ICD-10-CM | POA: Diagnosis not present

## 2019-05-28 DIAGNOSIS — M545 Low back pain: Secondary | ICD-10-CM | POA: Insufficient documentation

## 2019-05-28 DIAGNOSIS — M7918 Myalgia, other site: Secondary | ICD-10-CM

## 2019-05-28 LAB — POC URINE PREG, ED: Preg Test, Ur: NEGATIVE

## 2019-05-28 NOTE — ED Notes (Signed)
Patient transported to X-ray 
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
## 2019-05-28 NOTE — Discharge Instructions (Signed)

## 2019-05-28 NOTE — ED Notes (Signed)
Discharge instructions discussed with pt. Pt verbalized understanding. Pt stable and ambulatory. No signature pad available. 
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
## 2019-05-28 NOTE — ED Notes (Signed)
Pt ambulating well, independently to nurses desk to ask how much longer it would be.

## 2020-02-21 ENCOUNTER — Ambulatory Visit (HOSPITAL_COMMUNITY)
Admission: EM | Admit: 2020-02-21 | Discharge: 2020-02-21 | Disposition: A | Discharge status: Home / Self Care | Payer: Self-pay | Attending: Family Medicine | Admitting: Family Medicine

## 2020-02-21 ENCOUNTER — Encounter (HOSPITAL_COMMUNITY): Payer: Self-pay | Admitting: *Deleted

## 2020-02-21 ENCOUNTER — Other Ambulatory Visit: Payer: Self-pay

## 2020-02-21 DIAGNOSIS — Z3202 Encounter for pregnancy test, result negative: Secondary | ICD-10-CM

## 2020-02-21 DIAGNOSIS — N898 Other specified noninflammatory disorders of vagina: Secondary | ICD-10-CM | POA: Insufficient documentation

## 2020-02-21 DIAGNOSIS — R102 Pelvic and perineal pain: Secondary | ICD-10-CM

## 2020-02-21 LAB — POC URINE PREG, ED: Preg Test, Ur: NEGATIVE

## 2020-02-21 LAB — POCT URINALYSIS DIPSTICK, ED / UC
Bilirubin Urine: NEGATIVE
Glucose, UA: NEGATIVE mg/dL
Ketones, ur: NEGATIVE mg/dL
Leukocytes,Ua: NEGATIVE
Nitrite: NEGATIVE
Protein, ur: NEGATIVE mg/dL
Specific Gravity, Urine: >=1.03 (ref 1.005–1.030)
Urobilinogen, UA: 0.2 mg/dL (ref 0.0–1.0)
pH: 7 (ref 5.0–8.0)

## 2020-02-21 NOTE — ED Provider Notes (Signed)
[facility]    CSN: [id number] Arrival date & time: 02/21/20  4742      History   Chief Complaint Chief Complaint  Patient presents with  . Pelvic Pain  . Vaginal Discharge    HPI Darlene [name] is a 34 y.o. female.   Pt is a 34 year old female that presents with lower intermittent abdominal cramping, vaginal discharge for the past week or so.  Has been taking ibuprofen for the pain.  Denies any current pain.  Describes a discharge with mucousy, creamy, yellow.  Denies any specific concerns for STDs but would like to be checked. Patient's last menstrual period was 02/03/2020.      Past Medical History:  Diagnosis Date  . Chlamydia   . Hernia     Patient Active Problem List   Diagnosis Date Noted  . Active labor 01/31/2012  . Normal delivery 01/31/2012    History reviewed. No pertinent surgical history.  OB History    Gravida  2   Para  2   Term  2   Preterm      AB      Living  2     SAB      TAB      Ectopic      Multiple      Live Births  1            Home Medications    Prior to Admission medications   Medication Sig Start Date End Date Taking? Authorizing Provider  LO LOESTRIN FE 1 MG-10 MCG / 10 MCG tablet Take 1 tablet by mouth daily. Patient not taking: Reported on 04/04/2017 04/20/16 02/21/20  Brock Bad, MD    Family History Family History  Problem Relation Age of Onset  . Cancer Mother     Social History Social History   Tobacco Use  . Smoking status: Never Smoker  . Smokeless tobacco: Never Used  Vaping Use  . Vaping Use: Never used  Substance Use Topics  . Alcohol use: No    Alcohol/week: 1.0 standard drink    Types: 1 Glasses of wine per week  . Drug use: Yes    Types: Marijuana    Comment: occ     Allergies   Patient has no known allergies.   Review of Systems Review of Systems   Physical Exam Triage Vital Signs ED Triage Vitals  Enc Vitals Group     BP 02/21/20 0922  117/77     Pulse Rate 02/21/20 0922 82     Resp 02/21/20 0922 16     Temp 02/21/20 0922 98.1 F (36.7 C)     Temp Source 02/21/20 0922 Oral     SpO2 02/21/20 0922 100 %     Weight --      Height --      Head Circumference --      Peak Flow --      Pain Score 02/21/20 0919 4     Pain Loc --      Pain Edu? --      Excl. in GC? --    No data found.  Updated Vital Signs BP 117/77 (BP Location: Right Arm)   Pulse 82   Temp 98.1 F (36.7 C) (Oral)   Resp 16   LMP 02/03/2020   SpO2 100%   Visual Acuity Right Eye Distance:   Left Eye Distance:   Bilateral Distance:    Right Eye Near:  Left Eye Near:    Bilateral Near:     Physical Exam Vitals and nursing note reviewed.  Constitutional:      General: She is not in acute distress.    Appearance: Normal appearance. She is not ill-appearing, toxic-appearing or diaphoretic.  HENT:     Head: Normocephalic.  Eyes:     Conjunctiva/sclera: Conjunctivae normal.  Pulmonary:     Effort: Pulmonary effort is normal.  Musculoskeletal:        General: Normal range of motion.     Cervical back: Normal range of motion.  Skin:    General: Skin is warm and dry.     Findings: No rash.  Neurological:     Mental Status: She is alert.  Psychiatric:        Mood and Affect: Mood normal.      UC Treatments / Results  Labs (all labs ordered are listed, but only abnormal results are displayed) Labs Reviewed  POCT URINALYSIS DIPSTICK, ED / UC - Abnormal; Notable for the following components:      Result Value   Hgb urine dipstick TRACE (*)    All other components within normal limits  POC URINE PREG, ED  CERVICOVAGINAL ANCILLARY ONLY    EKG   Radiology No results found.  Procedures Procedures (including critical care time)  Medications Ordered in UC Medications - No data to display  Initial Impression / Assessment and Plan / UC Course  I have reviewed the triage vital signs and the nursing notes.  Pertinent labs &  imaging results that were available during my care of the patient were reviewed by me and considered in my medical decision making (see chart for details).     Vaginal discharge Swab sent for testing. Urine without infection or pregnancy. Follow up as needed for continued or worsening symptoms   Final Clinical Impressions(s) / UC Diagnoses   Final diagnoses:  Vaginal discharge     Discharge Instructions     Swab sent for testing  You can check my chart for results.     ED Prescriptions    None     PDMP not reviewed this encounter.   Dahlia Byes A, NP 02/21/20 1026

## 2020-02-21 NOTE — Discharge Instructions (Signed)
Swab sent for testing  You can check my chart for results.

## 2020-02-21 NOTE — ED Triage Notes (Signed)
Patient in with complaints of pelvic pain since last week. Patient states she feels pressure and pain to the left side of pelvis. Patient has been taking ibuprofen at home. Patient states that she has experienced "creamy" discharge that was clear/yellow in color.

## 2020-02-22 LAB — CERVICOVAGINAL ANCILLARY ONLY
Bacterial Vaginitis (gardnerella): POSITIVE — AB
Candida Glabrata: NEGATIVE
Candida Vaginitis: NEGATIVE
Chlamydia: NEGATIVE
Comment: NEGATIVE
Comment: NEGATIVE
Comment: NEGATIVE
Comment: NEGATIVE
Comment: NEGATIVE
Comment: NORMAL
Neisseria Gonorrhea: NEGATIVE
Trichomonas: POSITIVE — AB

## 2020-02-25 ENCOUNTER — Telehealth (HOSPITAL_COMMUNITY): Payer: Self-pay | Admitting: Emergency Medicine

## 2020-02-25 MED ORDER — METRONIDAZOLE 500 MG PO TABS: 500.0000 mg | ORAL_TABLET | Freq: Two times a day (BID) | ORAL | 0 refills | Status: AC
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
